# Patient Record
Sex: Male | Born: 1995
Health system: Southern US, Community
[De-identification: ages and names within clinical notes are randomized; demographics above are authoritative.]

## PROBLEM LIST (undated history)

## (undated) ENCOUNTER — Emergency Department (HOSPITAL_COMMUNITY): Admission: EM | Payer: Managed Care, Other (non HMO) | Source: Home / Self Care

## (undated) HISTORY — PX: KNEE SURGERY: SHX244

---
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
## 2013-09-21 ENCOUNTER — Other Ambulatory Visit: Payer: Self-pay | Admitting: Gastroenterology

## 2013-09-21 DIAGNOSIS — R1013 Epigastric pain: Secondary | ICD-10-CM

## 2013-09-21 DIAGNOSIS — R112 Nausea with vomiting, unspecified: Secondary | ICD-10-CM

## 2013-09-22 ENCOUNTER — Ambulatory Visit
Admission: RE | Admit: 2013-09-22 | Discharge: 2013-09-22 | Disposition: A | Discharge status: Home / Self Care | Payer: BC Managed Care – PPO | Source: Ambulatory Visit | Attending: Gastroenterology | Admitting: Gastroenterology

## 2013-09-22 DIAGNOSIS — R112 Nausea with vomiting, unspecified: Secondary | ICD-10-CM

## 2013-09-22 DIAGNOSIS — R1013 Epigastric pain: Secondary | ICD-10-CM

## 2013-11-09 IMAGING — CT CT HEAD W/O CM
3 of 4 series · 17 of 30 positions shown, 19 images · non-contrast
Comparison: None.

CT HEAD

CLINICAL DATA: Injury

CT HEAD WITHOUT CONTRAST
CT MAXILLOFACIAL WITHOUT CONTRAST
TECHNIQUE: Multidetector CT imaging of the head and maxillofacial
structures were performed using the standard protocol without
intravenous contrast. Multiplanar CT image reconstructions of the
maxillofacial structures were also generated.

[Series 2: head 4.8 h37s · axial · 0.45mm/px · z∈[-150,-33]mm · 5 of 36 slices shown, 7 images]
[im 6/36  brain]
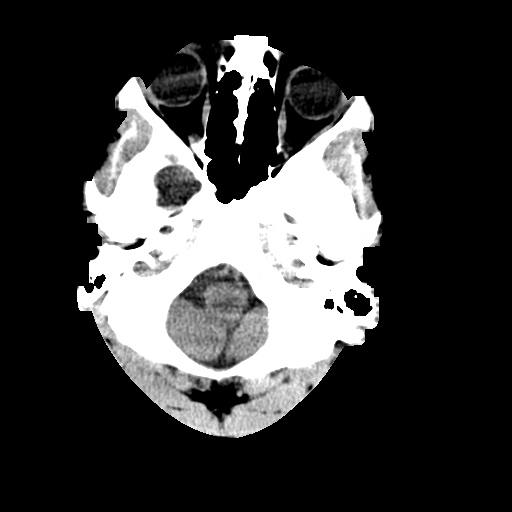
[im 6/36  bone]
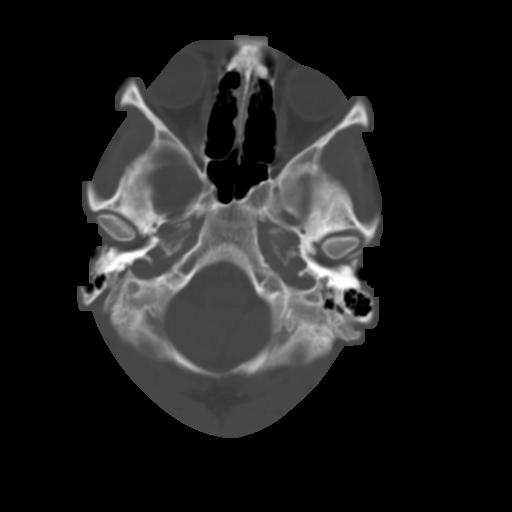
[im 12/36  brain]
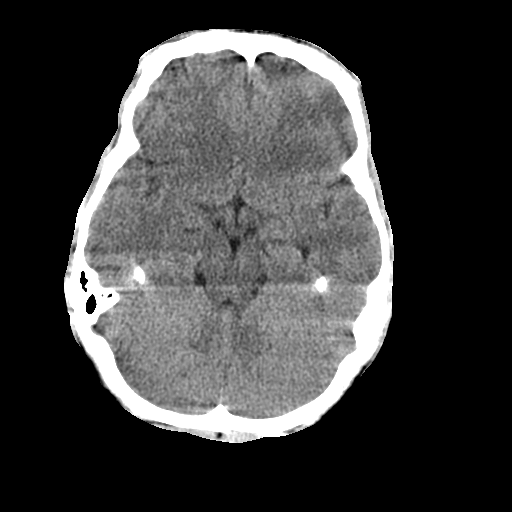
[im 18/36  brain]
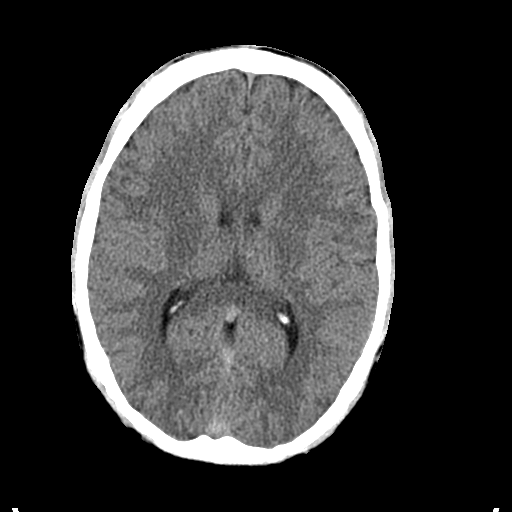
[im 24/36  brain]
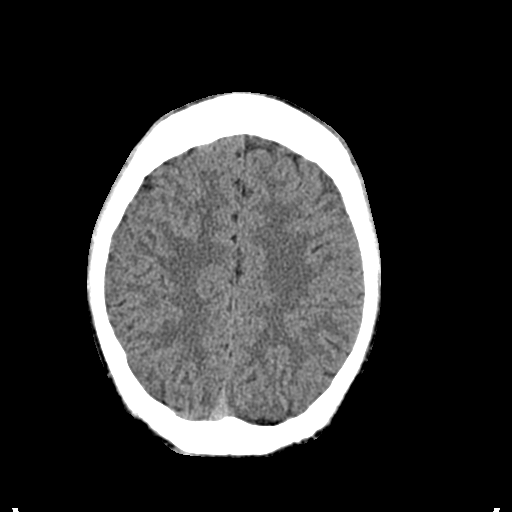
[im 30/36  brain]
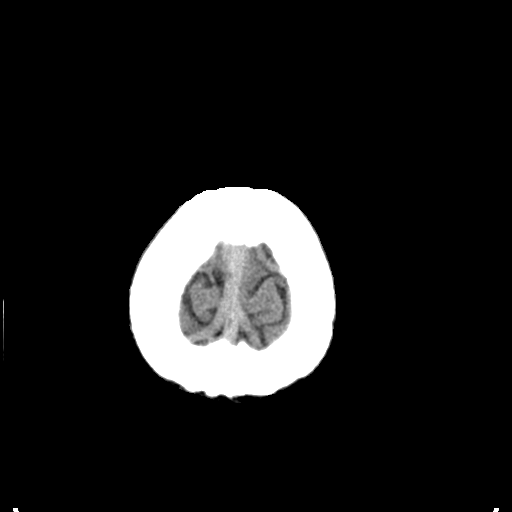
[im 30/36  bone]
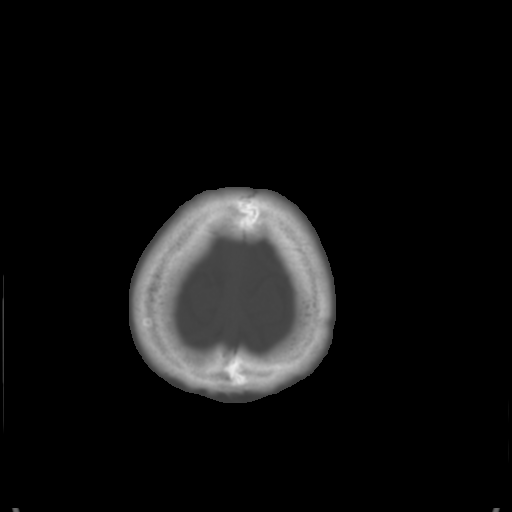

[Series 3: head 2.4 h60s bone · axial · 0.45mm/px · z∈[-163,-17]mm · 8 of 72 slices shown (1 of 2)]
[im 6/72  bone]
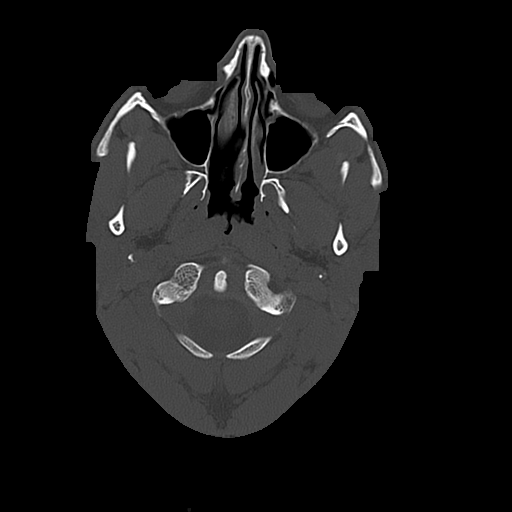
[im 17/72  bone]
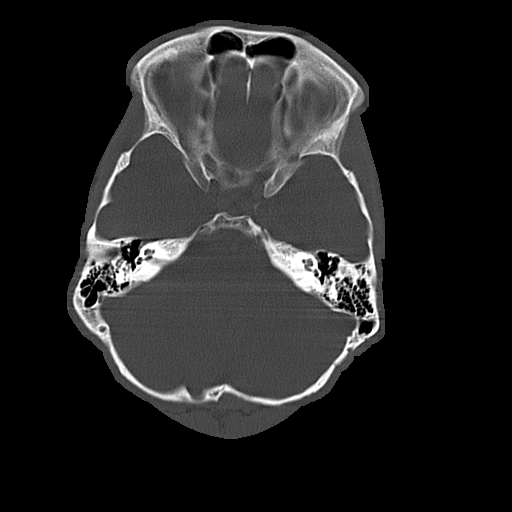
[im 22/72  bone]
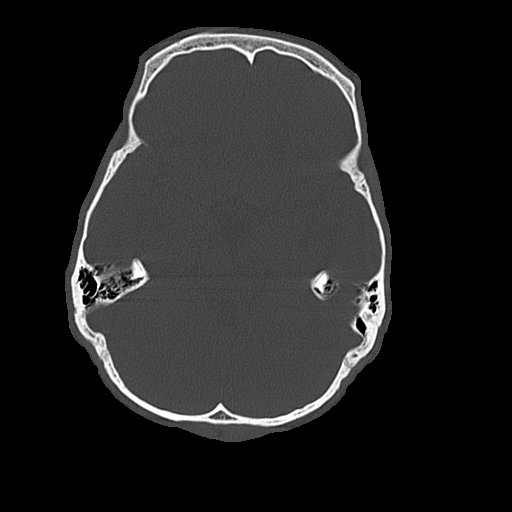
[im 33/72  bone]
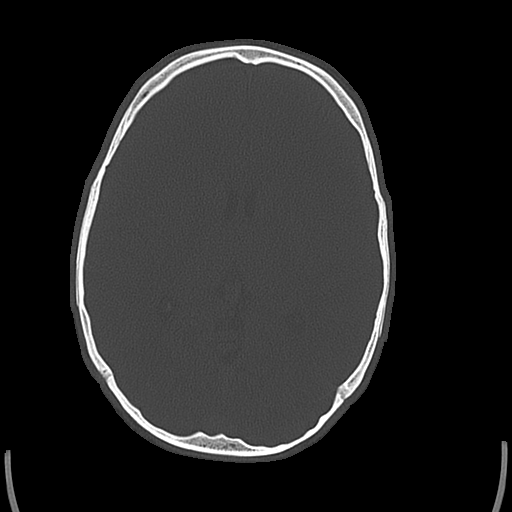
[im 39/72  bone]
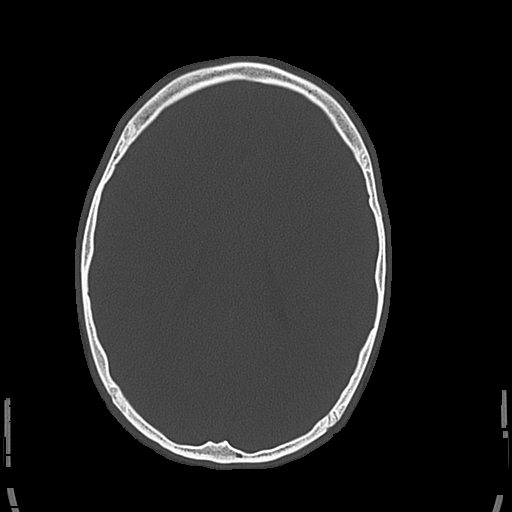
[im 50/72  bone]
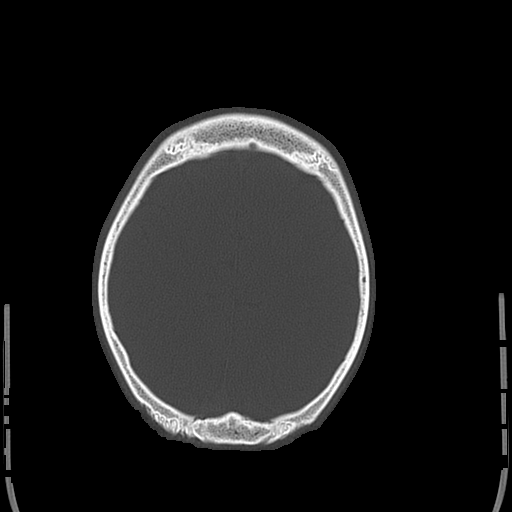
[im 55/72  bone]
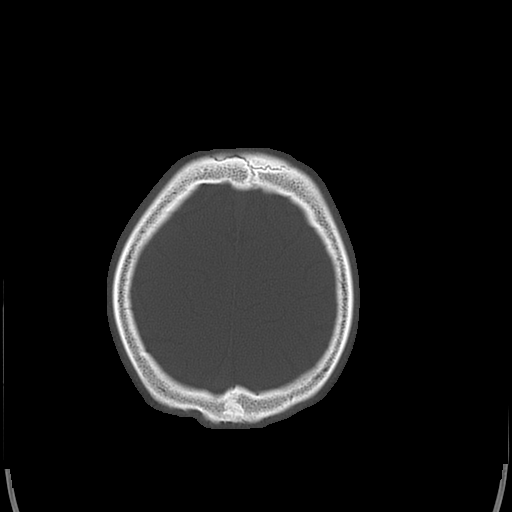
[im 66/72  bone]
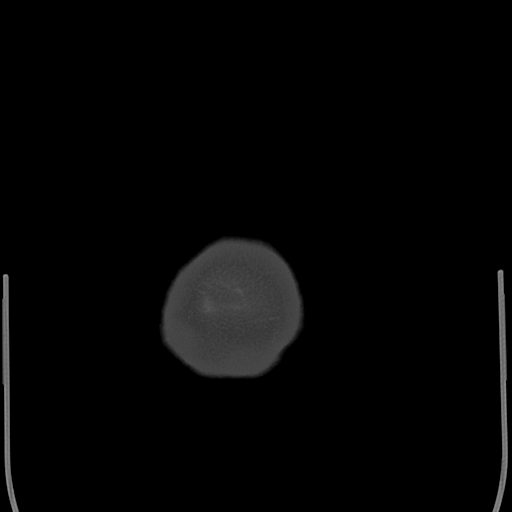

[Series 5: head 2.4 h60s bone · axial · 0.45mm/px · z∈[-151,-107]mm · 4 of 32 slices shown (2 of 2)]
[im 7/32  bone]
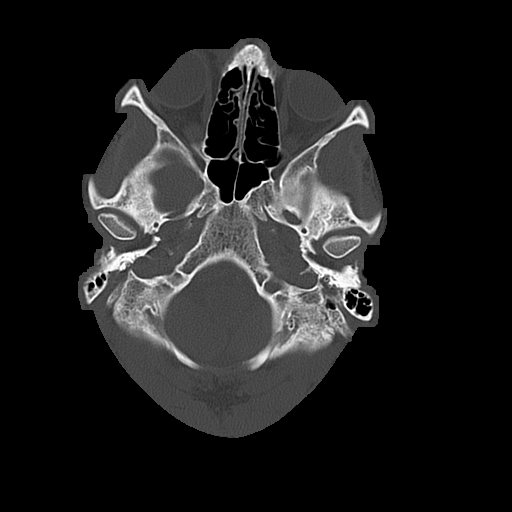
[im 13/32  bone]
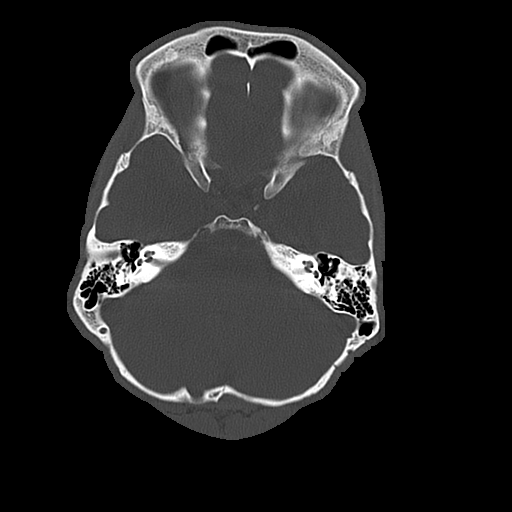
[im 19/32  bone]
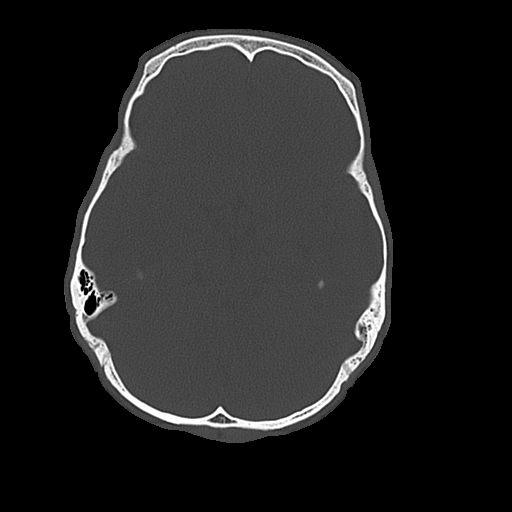
[im 25/32  bone]
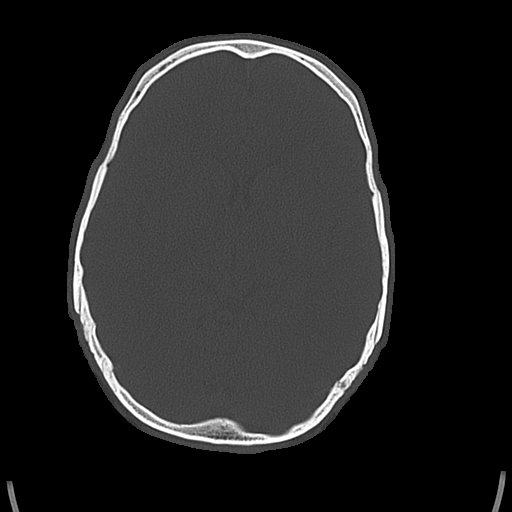

[17 of 30 positions shown; findings below may reference images not displayed]

FINDINGS: No mass effect, midline shift, or acute intracranial
hemorrhage.  Brain parenchyma, ventricles system, and extraaxial
space are within normal limits.  Mastoid air cells are clear.  Soft
tissue swelling over the right frontal bone.
IMPRESSION: No acute intracranial injury.

CT MAXILLOFACIAL
FINDINGS: Soft tissue swelling over the right frontal bone.  No
underlying fracture.  The mandible and maxilla are intact.  Right
orbital rim is intact.  Nasal bone is intact.  No evidence of
vitreous hemorrhage in the right globe.  No intraorbital
hemorrhage.  Nasal septum is deviated to the left.  Nonaggressive
sclerotic lesion in the right supra orbital rim.
IMPRESSION: No evidence of facial bone injury.  Soft tissue swelling over the
right frontal bone is noted.

## 2015-05-13 IMAGING — US US ABDOMEN COMPLETE
1 series · 14 of 25 positions shown · non-contrast
Comparison: None.

CLINICAL DATA: Abdominal pain.  Nausea and vomiting.  Anorexia.

EXAM:
ULTRASOUND ABDOMEN COMPLETE

[Series 1: us abdomen complete · 0.21mm/px · 14 of 67 slices shown]
[im 1/67]
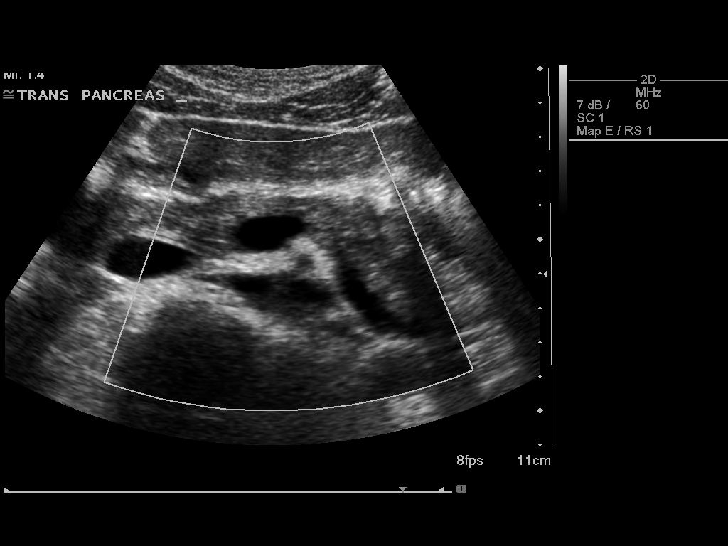
[im 6/67]
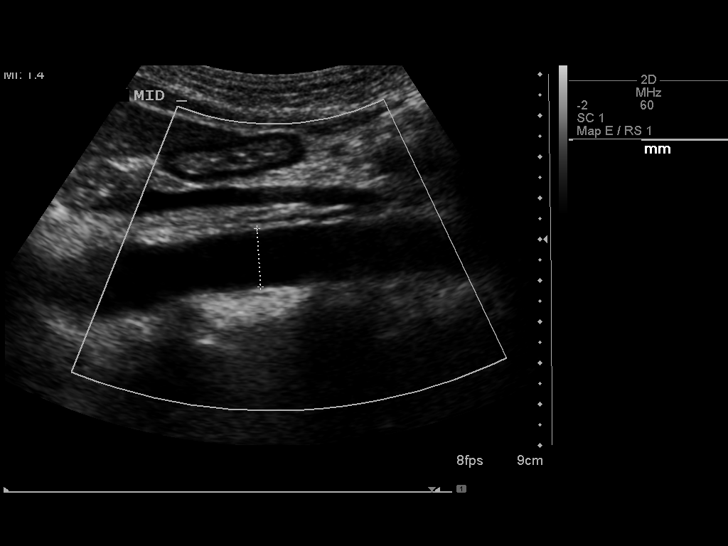
[im 12/67]
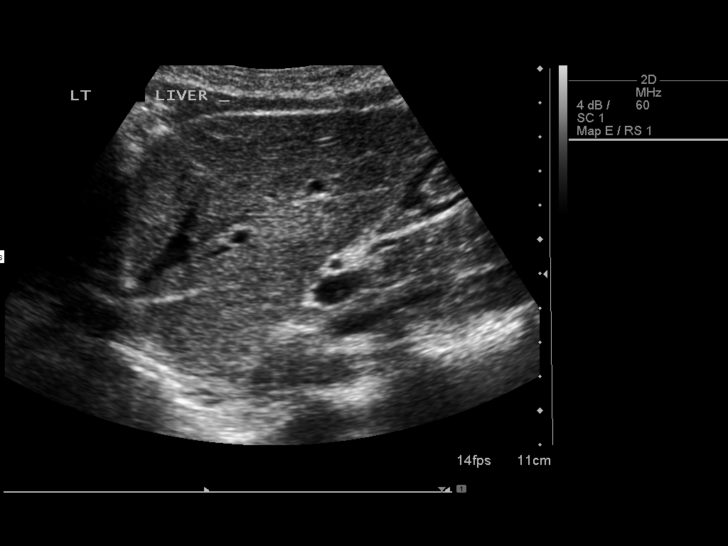
[im 17/67]
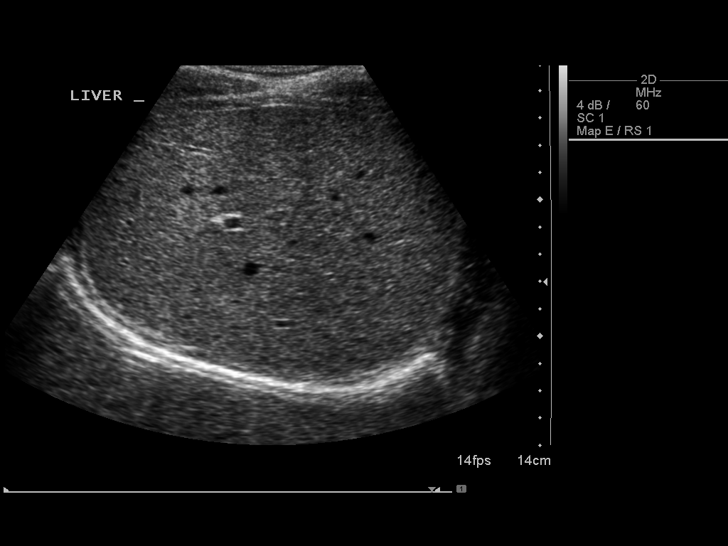
[im 23/67]
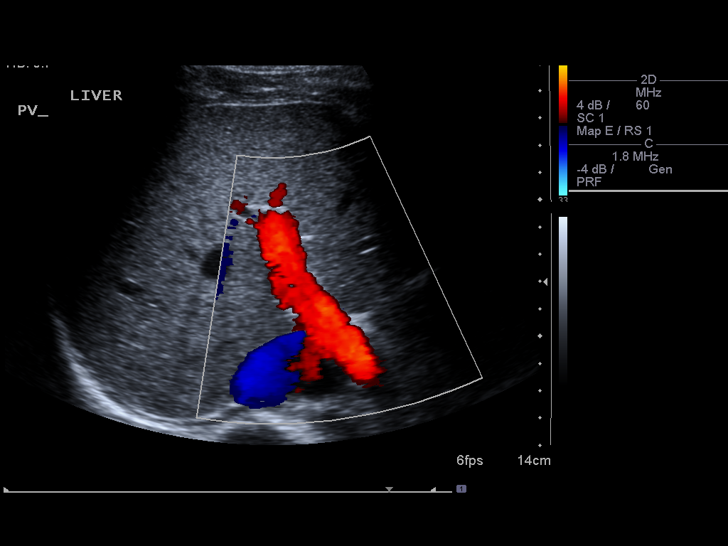
[im 25/67]
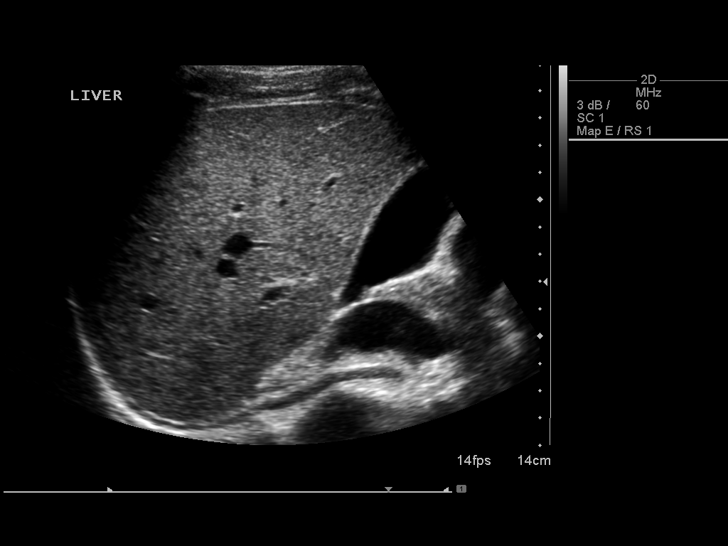
[im 31/67]
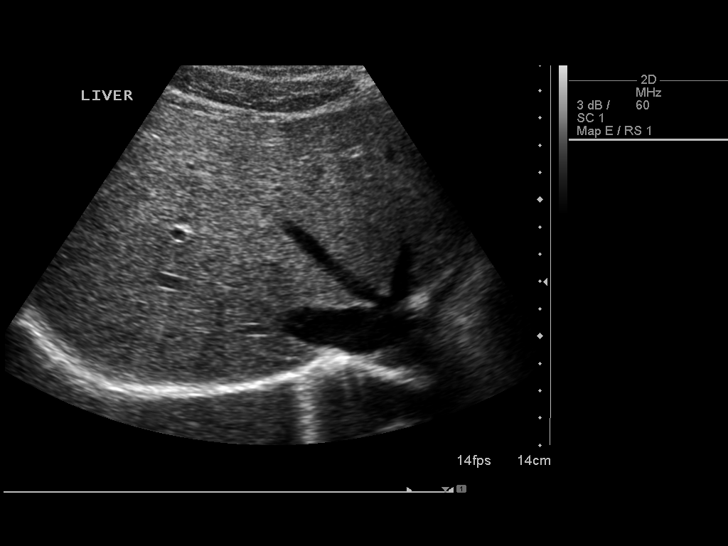
[im 36/67]
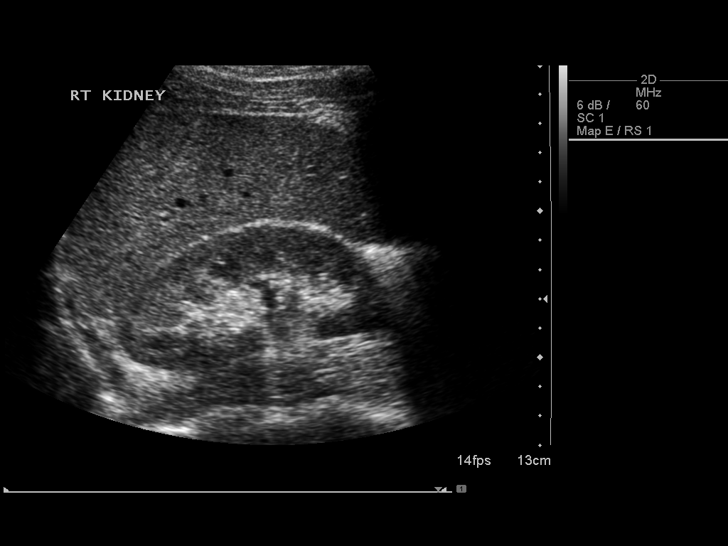
[im 42/67]
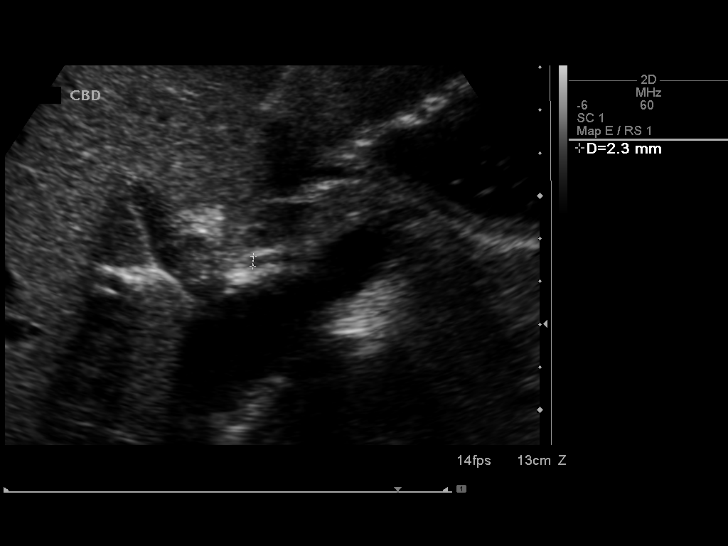
[im 45/67]
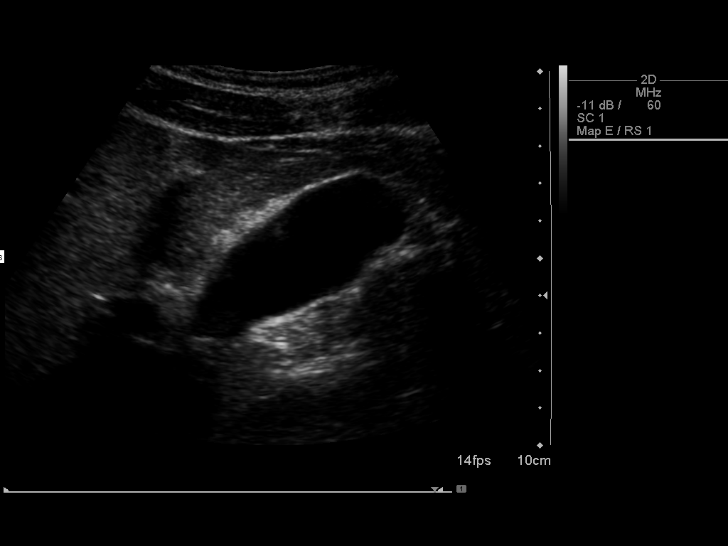
[im 50/67]
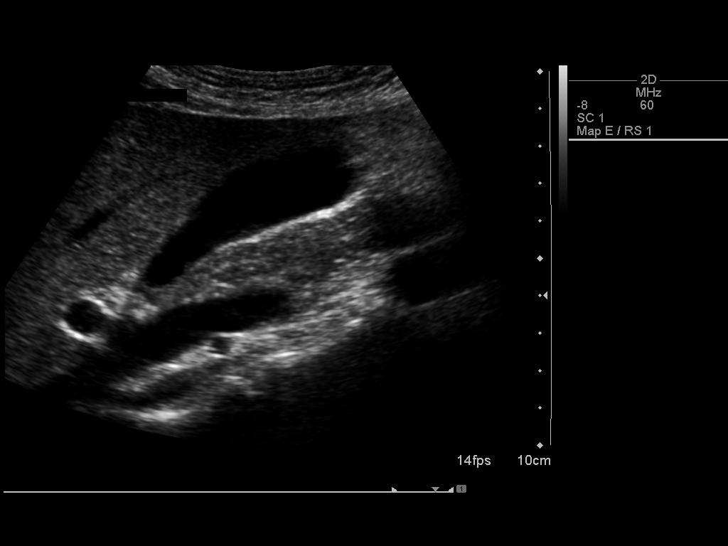
[im 56/67]
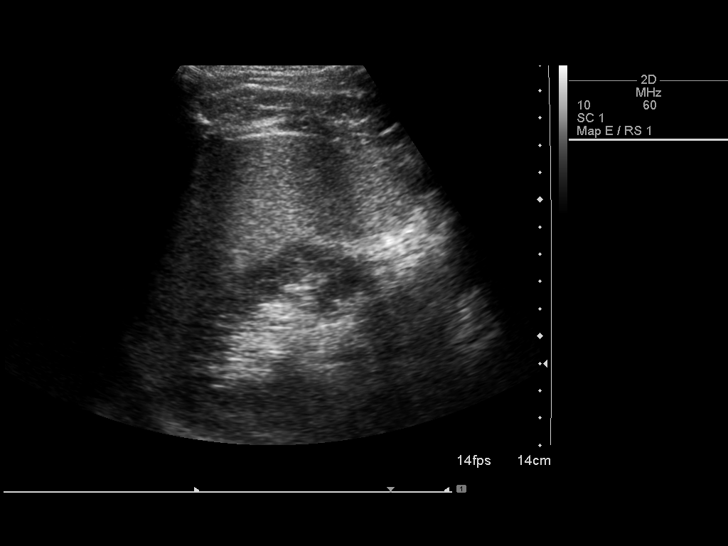
[im 61/67]
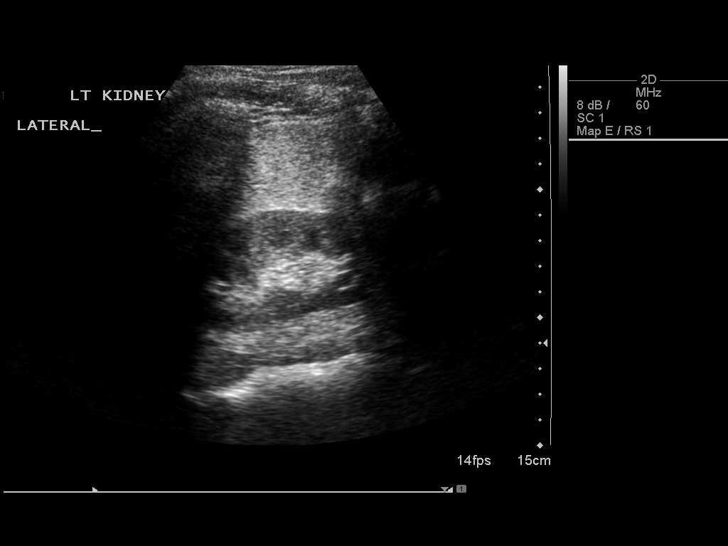
[im 67/67]
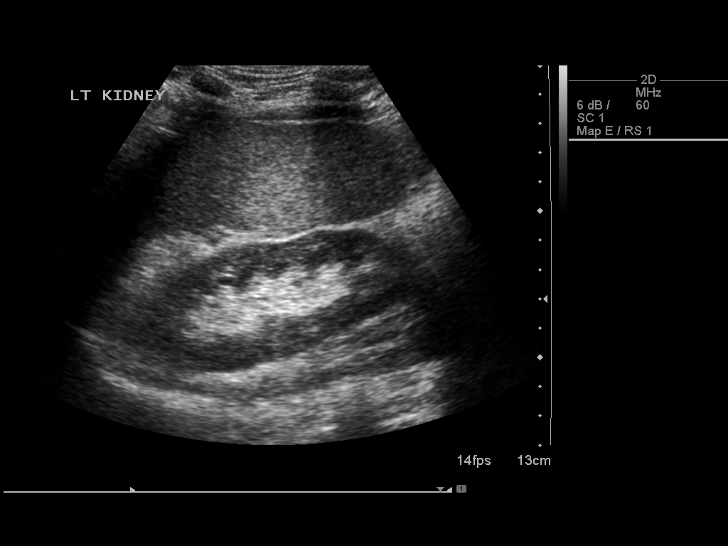

[14 of 25 positions shown; findings below may reference images not displayed]

FINDINGS: Gallbladder:

No gallstones or wall thickening visualized. No sonographic Murphy
sign noted.

Common bile duct:

Diameter: 2 mm

Liver:

No focal lesion identified. Within normal limits in parenchymal
echogenicity.

IVC:

No abnormality visualized.

Pancreas:

Visualized portion unremarkable.

Spleen:

Size and appearance within normal limits.

Right Kidney:

Length: 9.4 cm. Echogenicity within normal limits. No mass or
hydronephrosis visualized.

Left Kidney:

Length: 10.1 cm. Echogenicity within normal limits. No mass or
hydronephrosis visualized.

Abdominal aorta:

No aneurysm visualized.

Other findings:

None.
IMPRESSION: Negative. No evidence of gallstones, hydronephrosis, or other
abnormal findings.

## 2017-12-20 ENCOUNTER — Emergency Department (HOSPITAL_BASED_OUTPATIENT_CLINIC_OR_DEPARTMENT_OTHER)
Admission: EM | Admit: 2017-12-20 | Discharge: 2017-12-20 | Disposition: A | Discharge status: Home / Self Care | Payer: 59 | Attending: Emergency Medicine | Admitting: Emergency Medicine

## 2017-12-20 ENCOUNTER — Other Ambulatory Visit: Payer: Self-pay

## 2017-12-20 ENCOUNTER — Encounter (HOSPITAL_BASED_OUTPATIENT_CLINIC_OR_DEPARTMENT_OTHER): Payer: Self-pay | Admitting: *Deleted

## 2017-12-20 DIAGNOSIS — Z23 Encounter for immunization: Secondary | ICD-10-CM | POA: Insufficient documentation

## 2017-12-20 DIAGNOSIS — S0993XA Unspecified injury of face, initial encounter: Secondary | ICD-10-CM | POA: Diagnosis present

## 2017-12-20 DIAGNOSIS — Y9389 Activity, other specified: Secondary | ICD-10-CM | POA: Diagnosis not present

## 2017-12-20 DIAGNOSIS — Y929 Unspecified place or not applicable: Secondary | ICD-10-CM | POA: Diagnosis not present

## 2017-12-20 DIAGNOSIS — Y998 Other external cause status: Secondary | ICD-10-CM | POA: Insufficient documentation

## 2017-12-20 DIAGNOSIS — S0121XA Laceration without foreign body of nose, initial encounter: Secondary | ICD-10-CM | POA: Insufficient documentation

## 2017-12-20 MED ORDER — OXYMETAZOLINE HCL 0.05 % NA SOLN
1.0000 | Freq: Once | NASAL | Status: AC
  Administered 2017-12-20: 1 via NASAL
  Filled 2017-12-20: qty 15

## 2017-12-20 MED ORDER — OXYCODONE-ACETAMINOPHEN 5-325 MG PO TABS: 1.0000 | ORAL_TABLET | Freq: Three times a day (TID) | ORAL | 0 refills | Status: AC | PRN

## 2017-12-20 MED ORDER — CEPHALEXIN 250 MG PO CAPS
500.0000 mg | ORAL_CAPSULE | Freq: Once | ORAL | Status: AC
  Administered 2017-12-20: 500 mg via ORAL
  Filled 2017-12-20: qty 2

## 2017-12-20 MED ORDER — CEPHALEXIN 500 MG PO CAPS: 500.0000 mg | ORAL_CAPSULE | Freq: Four times a day (QID) | ORAL | 0 refills | Status: AC

## 2017-12-20 MED ORDER — LIDOCAINE-EPINEPHRINE-TETRACAINE (LET) SOLUTION
3.0000 mL | Freq: Once | NASAL | Status: AC
Start: 2017-12-20 — End: 2017-12-20
  Administered 2017-12-20: 3 mL via TOPICAL
  Filled 2017-12-20: qty 3

## 2017-12-20 MED ORDER — TETANUS-DIPHTH-ACELL PERTUSSIS 5-2.5-18.5 LF-MCG/0.5 IM SUSP
0.5000 mL | Freq: Once | INTRAMUSCULAR | Status: AC
  Administered 2017-12-20: 0.5 mL via INTRAMUSCULAR
  Filled 2017-12-20: qty 0.5

## 2017-12-20 MED ORDER — IBUPROFEN 400 MG PO TABS: 400.0000 mg | ORAL_TABLET | Freq: Four times a day (QID) | ORAL | 0 refills | Status: AC | PRN
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
